# Patient Record
Sex: Male | Born: 1985
Health system: Southern US, Community
[De-identification: ages and names within clinical notes are randomized; demographics above are authoritative.]

## PROBLEM LIST (undated history)

## (undated) DIAGNOSIS — J45909 Unspecified asthma, uncomplicated: Secondary | ICD-10-CM

## (undated) HISTORY — DX: Unspecified asthma, uncomplicated: J45.909

---

## 2013-09-03 ENCOUNTER — Telehealth (INDEPENDENT_AMBULATORY_CARE_PROVIDER_SITE_OTHER): Payer: Self-pay

## 2013-09-03 ENCOUNTER — Ambulatory Visit (INDEPENDENT_AMBULATORY_CARE_PROVIDER_SITE_OTHER): Payer: BC Managed Care – PPO | Admitting: General Surgery

## 2013-09-03 ENCOUNTER — Encounter (INDEPENDENT_AMBULATORY_CARE_PROVIDER_SITE_OTHER): Payer: Self-pay | Admitting: General Surgery

## 2013-09-03 ENCOUNTER — Ambulatory Visit
Admission: RE | Admit: 2013-09-03 | Discharge: 2013-09-03 | Disposition: A | Discharge status: Home / Self Care | Payer: BC Managed Care – PPO | Source: Ambulatory Visit | Attending: General Surgery | Admitting: General Surgery

## 2013-09-03 VITALS — BP 118/70 | Resp 18 | Ht 71.0 in | Wt 201.0 lb

## 2013-09-03 DIAGNOSIS — R1032 Left lower quadrant pain: Secondary | ICD-10-CM

## 2013-09-03 DIAGNOSIS — M549 Dorsalgia, unspecified: Secondary | ICD-10-CM

## 2013-09-03 NOTE — Progress Notes (Signed)
Subjective:     Patient ID: Randy Harmon, male   DOB: 09/05/1985, 28 y.o.   MRN: [id number]  HPI The patient is a 28 year old male who is being seen today secondary to a chief complaint of left groin/abdominal pain. 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 He states it back in December he knows while lifting heavy item furniture some stinging sensation as well as a pop and a burning sensation to his abdominal wall and left inguinal area. The patient states that since that time he's had conservative measures to include no heavy lifting and ice compresses. He states that since that time the pain has not gotten any better. He notes that his pain at times is while lying down. He states that he has more pain in the day as opposed to beginning the day. He has noticed no bulges. She does feel that a majority of his pain is in his left abdominal/inguinal area and radiates down into his testicle.    Review of Systems  Constitutional: Negative.   HENT: Negative.   Eyes: Negative.   Respiratory: Negative.   Cardiovascular: Negative.   Gastrointestinal: Positive for abdominal pain.  Endocrine: Negative.   Neurological: Negative.        Objective:   Physical Exam  Constitutional: He is oriented to person, place, and time. He appears well-developed and well-nourished.  HENT:  Head: Normocephalic and atraumatic.  Eyes: Conjunctivae and EOM are normal. Pupils are equal, round, and reactive to light.  Neck: Normal range of motion. Neck supple.  Cardiovascular: Normal rate, regular rhythm and normal heart sounds.   Pulmonary/Chest: Effort normal and breath sounds normal.  Abdominal: Soft. Bowel sounds are normal. He exhibits no distension and no mass. There is no tenderness. There is no rebound and no guarding. Hernia confirmed negative in the ventral area, confirmed negative in the right inguinal area and confirmed negative in the left inguinal area.   No overt hernia felt, however there is some weakness of the left inguinal abdominal wall.  Musculoskeletal: Normal range of motion.  Neurological: He is alert and oriented to person, place, and time.  Skin: Skin is warm and dry.       Assessment:     28 year old male with a possible left inguinal hernia     Plan:     1. At this time is difficult to see whether or not the patient does have an inguinal hernia. Will proceed with CT scan of the abdomen and pelvis to evaluate for possible local/abdominal wall as well as a left inguinal hernia. This could potentially be related to his spine and this can be evaluated I think as well as CT scan. 2. Once his results of didn't seem will call the patient and discussed the next step. This would entail either surgery or referral to orthopedic Physician. I have discussed this plan with him and he agrees.

## 2013-09-03 NOTE — Telephone Encounter (Signed)
Called pt to notify him that Dr Derrell Lollingamirez reviewed the CT scan from today and it does not show any evidence of a hernia or reasons for the abdominal pain. I advised pt that there is nothing surgical to be done at this time for his issues per Dr Derrell Lollingamirez. I did offer to refer the pt to see a orthopedic for poss. Herniated disc to see if this was causing any of the issues or the pt could go back to doctor that referred him to us today. The pt would like a referral to see orthopedic. I will place the order in epic and have the referral coordinator give the pt a call back with the appt info. The pt understands.

## 2013-09-03 NOTE — Addendum Note (Signed)
Addended by: Ethlyn GallerySPILLERS, Bryceson Grape on: 09/03/2013 09:43 AM   Modules accepted: Orders

## 2013-09-04 ENCOUNTER — Telehealth (INDEPENDENT_AMBULATORY_CARE_PROVIDER_SITE_OTHER): Payer: Self-pay | Admitting: *Deleted

## 2013-09-04 NOTE — Telephone Encounter (Signed)
Spoke to pt and made him aware of his appt at [facility] 3.24.15 arrival time 9:30 w/ appt time 9:45.  Pt understands and is in agreeance

## 2017-01-23 ENCOUNTER — Encounter (INDEPENDENT_AMBULATORY_CARE_PROVIDER_SITE_OTHER): Payer: Self-pay | Admitting: Orthopaedic Surgery

## 2017-01-23 ENCOUNTER — Ambulatory Visit (INDEPENDENT_AMBULATORY_CARE_PROVIDER_SITE_OTHER): Payer: BLUE CROSS/BLUE SHIELD | Admitting: Orthopaedic Surgery

## 2017-01-23 ENCOUNTER — Ambulatory Visit (INDEPENDENT_AMBULATORY_CARE_PROVIDER_SITE_OTHER): Payer: BLUE CROSS/BLUE SHIELD

## 2017-01-23 DIAGNOSIS — M5441 Lumbago with sciatica, right side: Secondary | ICD-10-CM | POA: Diagnosis not present

## 2017-01-23 DIAGNOSIS — M545 Low back pain, unspecified: Secondary | ICD-10-CM

## 2017-01-23 DIAGNOSIS — M5416 Radiculopathy, lumbar region: Secondary | ICD-10-CM | POA: Diagnosis not present

## 2017-01-23 NOTE — Progress Notes (Signed)
Office Visit Note   Patient: Randy Harmon           Date of Birth: August 25, 1985           MRN: [id number] Visit Date: 01/23/2017              Requested by: No referring provider defined for this encounter. PCP: Verlon AuBoyd, Tammy Lamonica, MD   Assessment & Plan: Visit Diagnoses:  1. Lumbar pain   2. Radiculopathy, lumbar region     Plan: With patient's ongoing chronic symptoms and failed conservative treatment with activity modification, steroid taper, muscle relaxer, pain medication, over-the-counter Tylenol/ibuprofen, home excise program recommend getting lumbar spine MRI to rule out HNP/stenosis and we'll compare this to previous study done 2015. He'll follow-up after completion to discuss results and further treatment options. His primary care physician has given him a note for work with restrictions and it is okay for him to continue that per her recommendation.  Follow-Up Instructions: Return in about 3 weeks (around 02/13/2017) for Review lumbar spine MRI with Dr. Ophelia CharterYates.   Orders:  Orders Placed This Encounter  Procedures  . XR Lumbar Spine 2-3 Views  . MR Lumbar Spine w/o contrast   No orders of the defined types were placed in this encounter.     Procedures: No procedures performed   Clinical Data: No additional findings.   Subjective: No chief complaint on file.   HPI Patient comes in today with complaints of chronic low back pain and left lower extremity pain, numbness/tingling, weakness. Seen by Dr.nitka in our office for lumbar spine issues 2015. Was conservatively managed with physical therapy, he had a prednisone taper, and also had lumbar ESI with Dr. Alvester MorinNewton. Has not been back to our office. Has continued to have chronic symptoms but has been trying to manage this with home excised program that was given by the therapist. He is also taking over-the-counter Tylenol and ibuprofen. Has been to an urgent care last month and was prescribed prednisone taper 2 weeks.  He was having some neck issues at that time in the medication help that problem but not his lumbar. Primary care physician also prescribed tramadol and baclofen.  MRI lumbar spine 09/17/2013 report read grade 1 retrolisthesis of L4 on L5. Small Schmorl's nodes are noted at L3-4. L3-4 disc bulge eccentric to the left results in mild left and minimal right neural foraminal stenosis. There is mild left lateral recess narrowing. Transitional lumbosacral anatomy with sacralization of L5. Not having any symptoms in the right lower extremity. Low back and left leg aggravated with all activity. No complaints of bowel or bladder continence.    Review of Systems No current cardiac pulmonary GI GU issues  Objective: Vital Signs: There were no vitals taken for this visit.  Physical Exam  Constitutional: He is oriented to person, place, and time. He appears well-developed and well-nourished. No distress.  HENT:  Head: Normocephalic and atraumatic.  Eyes: Pupils are equal, round, and reactive to light. EOM are normal.  Neck: Normal range of motion.  No brachial plexus or trapezius tenderness.  Pulmonary/Chest: No respiratory distress.  Musculoskeletal:  Gait is normal. Can heel and toe gait but does complain of some left buttock pain with doing this. Pain with lumbar extension. Pain with lumbar flexion with hands to mid thigh. Has left lumbar paraspinal tenderness. Positive left sciatic notch and this. Negative logroll. Discomfort with left straight leg raise. Negative on the right side. Trace anterior tib and gastroc weakness. Bilateral  calves are nontender. Bilateral upper extremities are strong. Left shoulder good range of motion. Mildly positive impingement test. Negative drop arm. Good cuff strength.  Neurological: He is alert and oriented to person, place, and time.  Skin: Skin is warm and dry.  Psychiatric: He has a normal mood and affect.    Ortho Exam  Specialty Comments:  No specialty  comments available.  Imaging: Xr Lumbar Spine 2-3 Views  Result Date: 01/23/2017 X-rays lumbar spine that show mild degenerative disc disease. No acute finding. Transitional lumbosacral anatomy.    PMFS History: There are no active problems to display for this patient.  Past Medical History:  Diagnosis Date  . Asthma     No family history on file.  No past surgical history on file. Social History   Occupational History  . Not on file.   Social History Main Topics  . Smoking status: Never Smoker  . Smokeless tobacco: Not on file  . Alcohol use No  . Drug use: No  . Sexual activity: Not on file

## 2017-01-29 ENCOUNTER — Ambulatory Visit (INDEPENDENT_AMBULATORY_CARE_PROVIDER_SITE_OTHER): Payer: Self-pay | Admitting: Orthopaedic Surgery

## 2017-02-02 ENCOUNTER — Ambulatory Visit
Admission: RE | Admit: 2017-02-02 | Discharge: 2017-02-02 | Disposition: A | Discharge status: Home / Self Care | Payer: BLUE CROSS/BLUE SHIELD | Source: Ambulatory Visit | Attending: Surgery | Admitting: Surgery

## 2017-02-02 DIAGNOSIS — M5416 Radiculopathy, lumbar region: Secondary | ICD-10-CM

## 2017-02-13 ENCOUNTER — Encounter (INDEPENDENT_AMBULATORY_CARE_PROVIDER_SITE_OTHER): Payer: Self-pay | Admitting: Orthopaedic Surgery

## 2017-02-13 ENCOUNTER — Ambulatory Visit (INDEPENDENT_AMBULATORY_CARE_PROVIDER_SITE_OTHER): Payer: BLUE CROSS/BLUE SHIELD | Admitting: Orthopaedic Surgery

## 2017-02-13 VITALS — BP 146/99 | HR 74 | Ht 71.0 in | Wt 210.0 lb

## 2017-02-13 DIAGNOSIS — M5126 Other intervertebral disc displacement, lumbar region: Secondary | ICD-10-CM

## 2017-02-13 NOTE — Progress Notes (Signed)
Office Visit Note   Patient: Randy Harmon           Date of Birth: 28-Oct-1985           MRN: [id number] Visit Date: 02/13/2017              Requested by: Verlon Au, MD [address] [place], [place] [address] PCP: Verlon Au, MD   Assessment & Plan: Visit Diagnoses:  1. Protrusion of lumbar intervertebral disc        Broad-based L4-5 protrusion with tiny central component.  Plan: Patient has absent nerve root tension signs. No isolated motor weakness. 2015 after a week or 2 of pain he did notice relief after epidural and he can call the like to repeat an epidural injection for the L4-5 disc protrusion. We reviewed the MRI scan that he had as well as one in 2015 and discussed in detail disc degeneration which runs in his family. He is active and has some modifications at work where he can change sitting and standing positions. Prescription was given for these to be continued. 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 At present he does not have indications for surgical intervention. He can call if he like to proceed with an epidural. We discussed workout activities with weight lifting modifications to avoid activities are likely to aggravate his disc problem. He understands he may get some improvement with time. I plan to recheck him in 3 months.  Follow-Up Instructions: Return in about 3 months (around 05/16/2017).   Orders:  No orders of the defined types were placed in this encounter.  No orders of the defined types were placed in this encounter.     Procedures: No procedures performed   Clinical Data: No additional findings.   Subjective: Chief Complaint  Patient presents with  . Lower Back - Pain, Follow-up    HPI 31 year old male is seen with ongoing problems with back pain he states his pain is 8 out of 10 in his lower back radiates in his left leg down to his foot. He states he had an episode with leg gave way on him. He likes to workout  regularly. He states his back aches all the time he has some numbness and tingling at times mostly in the left leg. He finished a prednisone pack but did not seem to get a lot of relief. He says symptoms since 2015. Currently is using some tramadol and hydrocodone. He takes meloxicam which seems to help some. His father has some back problems and a mother who had the and injury were all working also has back problems. Denies fever chills no gallbladder symptoms.  Review of Systems  Constitutional: Negative for chills and diaphoresis.  HENT: Negative for ear discharge, ear pain and nosebleeds.   Eyes: Negative for discharge and visual disturbance.  Respiratory: Negative for cough, choking and shortness of breath.   Cardiovascular: Negative for chest pain and palpitations.  Gastrointestinal: Negative for abdominal distention and abdominal pain.  Endocrine: Negative for cold intolerance and heat intolerance.  Genitourinary: Negative for flank pain and hematuria.  Musculoskeletal: Positive for back pain.  Skin: Negative for rash and wound.  Neurological: Negative for seizures and speech difficulty.  Hematological: Negative for adenopathy. Does not bruise/bleed easily.  Psychiatric/Behavioral: Negative for agitation and suicidal ideas.     Objective: Vital Signs: BP (!) 146/99   Pulse 74   Ht 5\' 11"  (1.803 m)   Wt 210 lb (95.3 kg)  BMI 29.29 kg/m   Physical Exam  Constitutional: He is oriented to person, place, and time. He appears well-developed and well-nourished.  HENT:  Head: Normocephalic and atraumatic.  Eyes: Pupils are equal, round, and reactive to light. EOM are normal.  Neck: No tracheal deviation present. No thyromegaly present.  Cardiovascular: Normal rate.   Pulmonary/Chest: Effort normal. He has no wheezes.  Abdominal: Soft. Bowel sounds are normal.  Musculoskeletal:  Patient gets from sitting standing on off the exam table easily. He can heel and toe walk. No isolated  motor weakness anterior tib EHL is perineal skin gastrocsoleus. Quads hamstrings are strong. He has mild sciatic notch tenderness. No rash over exposed skin no pain with hip range of motion is reach full extension. No synovitis of the wrist or fingers.  Neurological: He is alert and oriented to person, place, and time.  Skin: Skin is warm and dry. Capillary refill takes less than 2 seconds.  Psychiatric: He has a normal mood and affect. His behavior is normal. Judgment and thought content normal.    Ortho Exam  Specialty Comments:  No specialty comments available.  Imaging: Study Result   CLINICAL DATA:  Low back pain, left leg pain and weakness  EXAM: MRI LUMBAR SPINE WITHOUT CONTRAST  TECHNIQUE: Multiplanar, multisequence MR imaging of the lumbar spine was performed. No intravenous contrast was administered.  COMPARISON:  None.  FINDINGS: Segmentation: Transitional anatomy with sacral is a shin of the L5 vertebral body.  Alignment:  Physiologic.  Vertebrae:  No fracture, evidence of discitis, or bone lesion.  Conus medullaris: Extends to the T12 level and appears normal.  Paraspinal and other soft tissues: No paraspinal abnormality.  Disc levels:  Disc spaces: Disc heights are maintained.  T12-L1: No significant disc bulge. No evidence of neural foraminal stenosis. No central canal stenosis.  L1-L2: No significant disc bulge. No evidence of neural foraminal stenosis. No central canal stenosis.  L2-L3: Minimal broad-based disc bulge. No evidence of neural foraminal stenosis. No central canal stenosis.  L3-L4: Broad-based disc bulge. No evidence of neural foraminal stenosis. No central canal stenosis.  L4-L5: Broad-based disc bulge with a tiny central disc protrusion. No evidence of neural foraminal stenosis. No central canal stenosis.  L5-S1: No significant disc bulge. No evidence of neural foraminal stenosis. No central canal  stenosis.  IMPRESSION: 1. Mild broad-based disc bulges at L2-3 and L3-4. 2. At L4-5 there is a broad-based disc bulge with a tiny central disc protrusion. No foraminal or central canal stenosis.   Electronically Signed   By: Elige KoHetal  Patel   On: 02/02/2017 09:04       PMFS History: There are no active problems to display for this patient.  Past Medical History:  Diagnosis Date  . Asthma     No family history on file.  No past surgical history on file. Social History   Occupational History  . Not on file.   Social History Main Topics  . Smoking status: Never Smoker  . Smokeless tobacco: Never Used  . Alcohol use No  . Drug use: No  . Sexual activity: Not on file

## 2017-05-15 ENCOUNTER — Encounter (INDEPENDENT_AMBULATORY_CARE_PROVIDER_SITE_OTHER): Payer: Self-pay | Admitting: Orthopaedic Surgery

## 2017-05-15 ENCOUNTER — Ambulatory Visit (INDEPENDENT_AMBULATORY_CARE_PROVIDER_SITE_OTHER): Payer: BLUE CROSS/BLUE SHIELD | Admitting: Orthopaedic Surgery

## 2017-05-15 VITALS — BP 141/90 | HR 57

## 2017-05-15 DIAGNOSIS — M5126 Other intervertebral disc displacement, lumbar region: Secondary | ICD-10-CM

## 2017-05-15 DIAGNOSIS — M5136 Other intervertebral disc degeneration, lumbar region: Secondary | ICD-10-CM

## 2017-05-15 DIAGNOSIS — M51369 Other intervertebral disc degeneration, lumbar region without mention of lumbar back pain or lower extremity pain: Secondary | ICD-10-CM

## 2017-05-22 ENCOUNTER — Encounter (INDEPENDENT_AMBULATORY_CARE_PROVIDER_SITE_OTHER): Payer: Self-pay | Admitting: Orthopaedic Surgery

## 2017-05-22 NOTE — Progress Notes (Signed)
Office Visit Note   Patient: Randy Harmon           Date of Birth: Sep 28, 1985           MRN: [id number] Visit Date: 05/15/2017              Requested by: Verlon AuBoyd, Tammy Lamonica, MD [address] [place], [place] [id number] PCP: Verlon AuBoyd, Tammy Lamonica, MD   Assessment & Plan: Visit Diagnoses:  1. Bulge of lumbar disc without myelopathy     Plan: Patient has 3 young children.  We discussed the MRI findings with slight bulge levels in the lumbar spine without compression.  Patient had a normal CT abdomen and pelvis in 2015.  Walking program for strengthening exercises.  He does not have any areas that require surgical at this point spine wellness program for the symptoms that he is having.  12 months return visit for recheck.  Follow-Up Instructions: Return in about 12 months (around 05/15/2018).   Orders:  No orders of the defined types were placed in this encounter.  No orders of the defined types were placed in this encounter.     Procedures: No procedures performed   Clinical Data: No additional findings.   Subjective: Chief Complaint  Patient presents with  . Lower Back - Pain, Follow-up    HPI 31 year old male returns persistent problems with back pain with intermittent flareup pain that radiates mostly down the left side.  He has difficulty sitting or standing for long period of time problems sleeping on the left side sometimes he has some pins and needles in his left leg.  Tylenol and home exercise program.  The graft 2 months ago and had some increased pain.  Problems with that activity.  MRI scan has been obtained findings.  Ultracet also Mobic short-term treatment with hydrocodone.  2015 he went through physical therapy as well as prednisone taper and had one epidural injection with Dr. Alvester MorinNewton.  He is also been on baclofen.  No symptoms on his right leg.  No bowel or bladder symptoms.  Review of Systems 14 point review of systems updated unchanged from  01/2017 office visit.   Objective: Vital Signs: BP (!) 141/90   Pulse (!) 57   Physical Exam  Constitutional: He is oriented to person, place, and time. He appears well-developed and well-nourished.  HENT:  Head: Normocephalic and atraumatic.  Eyes: EOM are normal. Pupils are equal, round, and reactive to light.  Neck: No tracheal deviation present. No thyromegaly present.  Cardiovascular: Normal rate.  Pulmonary/Chest: Effort normal. He has no wheezes.  Abdominal: Soft. Bowel sounds are normal.  Neurological: He is alert and oriented to person, place, and time.  Skin: Skin is warm and dry. Capillary refill takes less than 2 seconds.  Psychiatric: He has a normal mood and affect. His behavior is normal. Judgment and thought content normal.    Ortho Exam patient can heel and toe walk.  He has mild left sciatic notch tenderness.  Negative logroll to the hip.  Popliteal compression test.  Anterior tib EHL is intact.  No calf tenderness.  Distal pulses are normal.  Normal upper extremity reflexes.  Back is normal.  Specialty Comments:  No specialty comments available.  Imaging: CLINICAL DATA:  Low back pain, left leg pain and weakness  EXAM: MRI LUMBAR SPINE WITHOUT CONTRAST  TECHNIQUE: Multiplanar, multisequence MR imaging of the lumbar spine was performed. No intravenous contrast was administered.  COMPARISON:  None.  FINDINGS: Segmentation: Transitional anatomy with sacral is a shin of the L5 vertebral body.  Alignment:  Physiologic.  Vertebrae:  No fracture, evidence of discitis, or bone lesion.  Conus medullaris: Extends to the T12 level and appears normal.  Paraspinal and other soft tissues: No paraspinal abnormality.  Disc levels:  Disc spaces: Disc heights are maintained.  T12-L1: No significant disc bulge. No evidence of neural foraminal stenosis. No central canal stenosis.  L1-L2: No significant disc bulge. No evidence of neural  foraminal stenosis. No central canal stenosis.  L2-L3: Minimal broad-based disc bulge. No evidence of neural foraminal stenosis. No central canal stenosis.  L3-L4: Broad-based disc bulge. No evidence of neural foraminal stenosis. No central canal stenosis.  L4-L5: Broad-based disc bulge with a tiny central disc protrusion. No evidence of neural foraminal stenosis. No central canal stenosis.  L5-S1: No significant disc bulge. No evidence of neural foraminal stenosis. No central canal stenosis.  IMPRESSION: 1. Mild broad-based disc bulges at L2-3 and L3-4. 2. At L4-5 there is a broad-based disc bulge with a tiny central disc protrusion. No foraminal or central canal stenosis.   Electronically Signed   By: Elige KoHetal  Patel   On: 02/02/2017 09:04   PMFS History: There are no active problems to display for this patient.  Past Medical History:  Diagnosis Date  . Asthma     History reviewed. No pertinent family history.  History reviewed. No pertinent surgical history. Social History   Occupational History  . Not on file  Tobacco Use  . Smoking status: Never Smoker  . Smokeless tobacco: Never Used  Substance and Sexual Activity  . Alcohol use: No  . Drug use: No  . Sexual activity: Not on file

## 2017-10-21 DIAGNOSIS — Z0289 Encounter for other administrative examinations: Secondary | ICD-10-CM | POA: Diagnosis not present

## 2017-10-21 DIAGNOSIS — M5126 Other intervertebral disc displacement, lumbar region: Secondary | ICD-10-CM | POA: Diagnosis not present

## 2017-10-25 ENCOUNTER — Ambulatory Visit (INDEPENDENT_AMBULATORY_CARE_PROVIDER_SITE_OTHER): Payer: BLUE CROSS/BLUE SHIELD | Admitting: Orthopaedic Surgery

## 2018-01-20 DIAGNOSIS — R7301 Impaired fasting glucose: Secondary | ICD-10-CM | POA: Diagnosis not present

## 2018-01-20 DIAGNOSIS — J4520 Mild intermittent asthma, uncomplicated: Secondary | ICD-10-CM | POA: Diagnosis not present

## 2018-01-20 DIAGNOSIS — Z0000 Encounter for general adult medical examination without abnormal findings: Secondary | ICD-10-CM | POA: Diagnosis not present

## 2018-05-14 ENCOUNTER — Ambulatory Visit (INDEPENDENT_AMBULATORY_CARE_PROVIDER_SITE_OTHER): Payer: BLUE CROSS/BLUE SHIELD | Admitting: Orthopaedic Surgery

## 2018-06-06 ENCOUNTER — Ambulatory Visit (INDEPENDENT_AMBULATORY_CARE_PROVIDER_SITE_OTHER): Payer: 59 | Admitting: Orthopaedic Surgery

## 2018-06-06 ENCOUNTER — Encounter (INDEPENDENT_AMBULATORY_CARE_PROVIDER_SITE_OTHER): Payer: Self-pay | Admitting: Orthopaedic Surgery

## 2018-06-06 VITALS — BP 146/93 | HR 58 | Ht 71.0 in | Wt 220.0 lb

## 2018-06-06 DIAGNOSIS — M5416 Radiculopathy, lumbar region: Secondary | ICD-10-CM

## 2018-06-06 DIAGNOSIS — M2550 Pain in unspecified joint: Secondary | ICD-10-CM | POA: Diagnosis not present

## 2018-06-06 DIAGNOSIS — M7542 Impingement syndrome of left shoulder: Secondary | ICD-10-CM

## 2018-06-06 DIAGNOSIS — G8929 Other chronic pain: Secondary | ICD-10-CM | POA: Diagnosis not present

## 2018-06-06 DIAGNOSIS — M533 Sacrococcygeal disorders, not elsewhere classified: Secondary | ICD-10-CM

## 2018-06-06 LAB — CBC
HCT: 47.1 % (ref 38.5–50.0)
Hemoglobin: 16.1 g/dL (ref 13.2–17.1)
MCH: 29.1 pg (ref 27.0–33.0)
MCHC: 34.2 g/dL (ref 32.0–36.0)
MCV: 85 fL (ref 80.0–100.0)
MPV: 10 fL (ref 7.5–12.5)
Platelets: 379 10*3/uL (ref 140–400)
RBC: 5.54 10*6/uL (ref 4.20–5.80)
RDW: 12.4 % (ref 11.0–15.0)
WBC: 4.9 10*3/uL (ref 3.8–10.8)

## 2018-06-06 LAB — SEDIMENTATION RATE: SED RATE: 2 mm/h (ref 0–15)

## 2018-06-06 NOTE — Progress Notes (Signed)
Office Visit Note   Patient: Randy Harmon           Date of Birth: Jan 16, 1986           MRN: [id number] Visit Date: 06/06/2018              Requested by: Bartholome Bill, [address] [place], [place] [id number] PCP: Bartholome Bill, MD   Assessment & Plan: Visit Diagnoses:  1. Radiculopathy, lumbar region   2. Chronic left SI joint pain   3. Impingement syndrome of left shoulder   4. Polyarthralgia     Plan: Since patient continues have ongoing symptoms with chronic low back pain and left lower extremity radicular symptoms that has failed multiple methods of conservative treatment including inversion table, TENS, narcotic medication, oral NSAIDs I will repeat lumbar MRI to compare to study that was done June 2018.  By exam he also has pain over the left SI joint and with his report of left hip joint pain I will also get blood work today to check a CBC and arthritis panel.  He is not sure if he has a family history of inflammatory arthropathy.  Follow Dr. Lorin Mercy in 3 weeks to discuss MRI and blood work.  Advised patient that depending upon what is found on the MRI that we may also consider scheduling him to have a diagnostic/therapeutic left SI joint injection.  At some point he also may need further work-up of his cervical spine and left shoulder for issues there that we briefly discussed today.  All questions answered.  Follow-Up Instructions: Return in about 3 weeks (around 06/27/2018) for For review of lumbar MRI and blood work with Dr. Lorin Mercy.   Orders:  Orders Placed This Encounter  Procedures  . MR Lumbar Spine w/o contrast  . CBC  . Antinuclear Antib (ANA)  . Rheumatoid Factor  . Uric acid  . Sed Rate (ESR)   No orders of the defined types were placed in this encounter.     Procedures: No procedures performed   Clinical Data: No additional findings.   Subjective: Chief Complaint  Patient presents with  . Lower Back - Follow-up     HPI 32 year old white male returns for his yearly follow-up for low back pain and left leg symptoms.  He states that he has good days and bad days.  States that he uses his TENS unit 2-3 times per day along with using an inversion table at least 3 times a day.  Intermittently takes Norco prescribed by his primary care physician.  Also uses ibuprofen.  All these methods of conservative treatment give temporary improvement.  He has 3 small children and admits that he is not able to do a lot of playing activity with them due to his pain.  States he and his wife are expecting another child soon.  Localizes pain more to the left low back.  Pain also over the left SI joint.  Continues to have numbness and tingling radiating down the left leg.  No symptoms on the right side.  States that he gets off-and-on episodes of left groin pain as well.  This is not relieved with stretching exercises.  He is not sure if he has a family history of inflammatory arthropathy.  During questioning he brings up some off-and-on issues with left-sided neck pain and left lower arm numbness and tingling.  Also pain left shoulder with overactivity and reaching on his back.  Reports a  left shoulder forceful abduction/external rotation injury while playing football in high school.  This required an emergency room visit that led to several weeks of formal PT.    Review of Systems No current cardiac pulmonary GI GU issues  Objective: Vital Signs: BP (!) 146/93   Pulse (!) 58   Ht _0  (1.803 m)   Wt 220 lb (99.8 kg)   BMI 30.68 kg/m   Physical Exam HENT:     Head: Normocephalic and atraumatic.  Neck:     Comments: Service my good range of motion.  He has moderate to marked left brachial plexus tenderness.  Negative on the right side. Pulmonary:     Effort: Pulmonary effort is normal.     Breath sounds: Normal breath sounds.  Musculoskeletal:     Comments: Left shoulder has good range of motion but with some  discomfort.  Positive impingement test.  Positive O'Brien test.  Right shoulder unremarkable.  Bilateral skin range of motion.  Negative Tinel's over the cubital tunnels.  Bilateral wrist negative Phalen's and Tinel's.  Positive left greater than right SI joint tenderness.  Markedly positive left FABER test.  Negative on the right side.  Neurovascularly intact.  No focal motor deficits.  Bilateral knee and ankle exam unremarkable.  Skin:    General: Skin is warm and dry.  Neurological:     Mental Status: He is alert.  Psychiatric:        Mood and Affect: Mood normal.     Ortho Exam  Specialty Comments:  No specialty comments available.  Imaging: No results found.   PMFS History: There are no active problems to display for this patient.  Past Medical History:  Diagnosis Date  . Asthma     History reviewed. No pertinent family history.  History reviewed. No pertinent surgical history. Social History   Occupational History  . Not on file  Tobacco Use  . Smoking status: Never Smoker  . Smokeless tobacco: Never Used  Substance and Sexual Activity  . Alcohol use: No  . Drug use: No  . Sexual activity: Not on file

## 2018-06-09 LAB — URIC ACID: Uric Acid, Serum: 6.1 mg/dL (ref 4.0–8.0)

## 2018-06-09 LAB — ANA: Anti Nuclear Antibody(ANA): NEGATIVE

## 2018-06-09 LAB — RHEUMATOID FACTOR: Rheumatoid fact SerPl-aCnc: <14 IU/mL (ref <14)

## 2018-06-21 ENCOUNTER — Other Ambulatory Visit: Payer: BLUE CROSS/BLUE SHIELD

## 2018-06-23 DIAGNOSIS — M5126 Other intervertebral disc displacement, lumbar region: Secondary | ICD-10-CM | POA: Diagnosis not present

## 2018-09-15 DIAGNOSIS — K529 Noninfective gastroenteritis and colitis, unspecified: Secondary | ICD-10-CM | POA: Diagnosis not present

## 2019-05-30 IMAGING — MR MR LUMBAR SPINE W/O CM
4 of 5 series · 17 of 48 positions shown · non-contrast
Comparison: None.

CLINICAL DATA: Low back pain, left leg pain and weakness

EXAM:
MRI LUMBAR SPINE WITHOUT CONTRAST
TECHNIQUE: Multiplanar, multisequence MR imaging of the lumbar spine was
performed. No intravenous contrast was administered.

[Series 6: T2 · sagittal · 4.0mm · 0.76mm/px · 4 of 15 slices shown (1 of 2)]
[im 1/15]
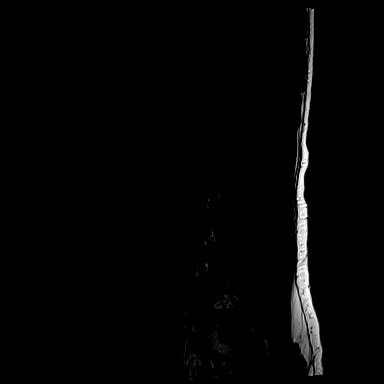
[im 5/15]
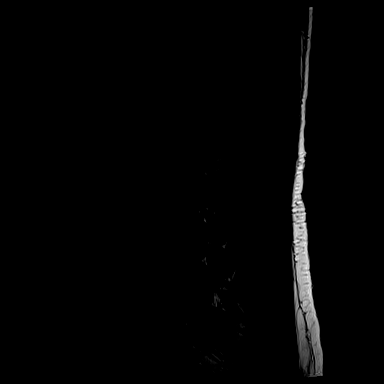
[im 10/15]
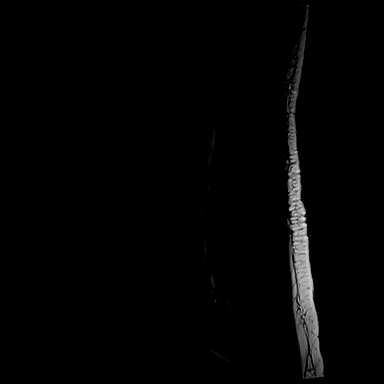
[im 15/15]
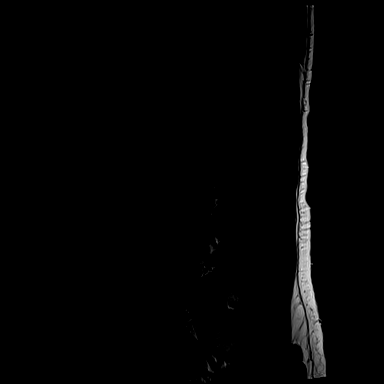

[Series 7: T1 · sagittal · 4.0mm · 0.76mm/px · 3 of 15 slices shown (1 of 2)]
[im 1/15]
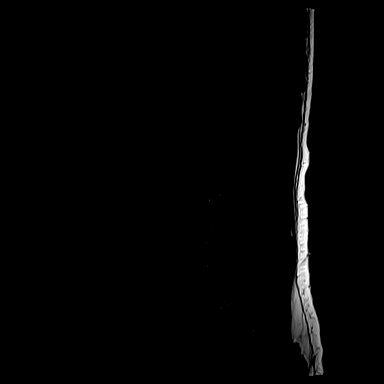
[im 8/15]
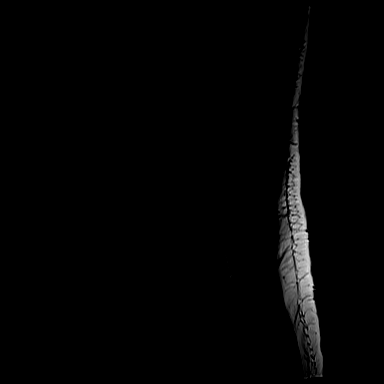
[im 15/15]
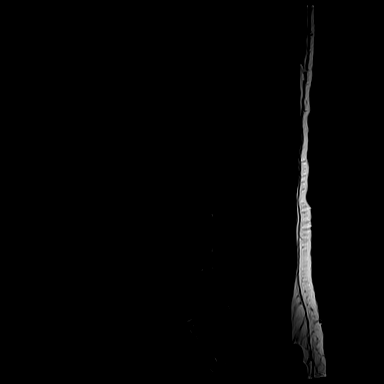

[Series 11: T1 · axial · 4.0mm · 0.28mm/px · z∈[-62,+144]mm · 3 of 54 slices shown (2 of 2)]
[im 7/54]
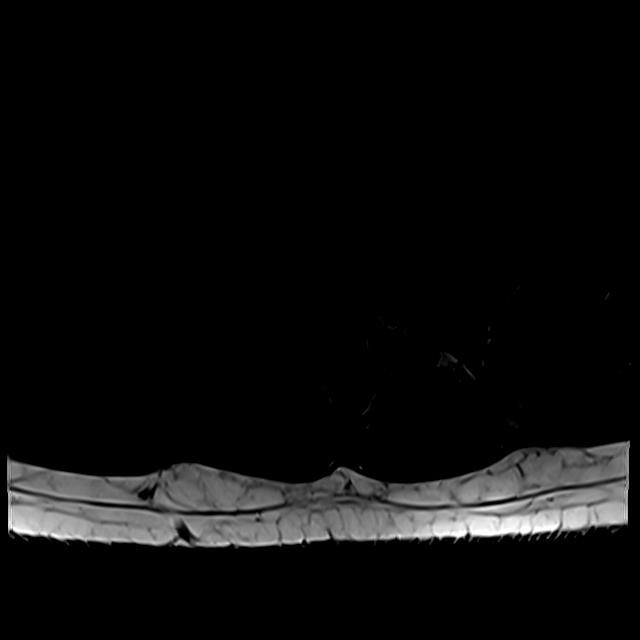
[im 27/54]
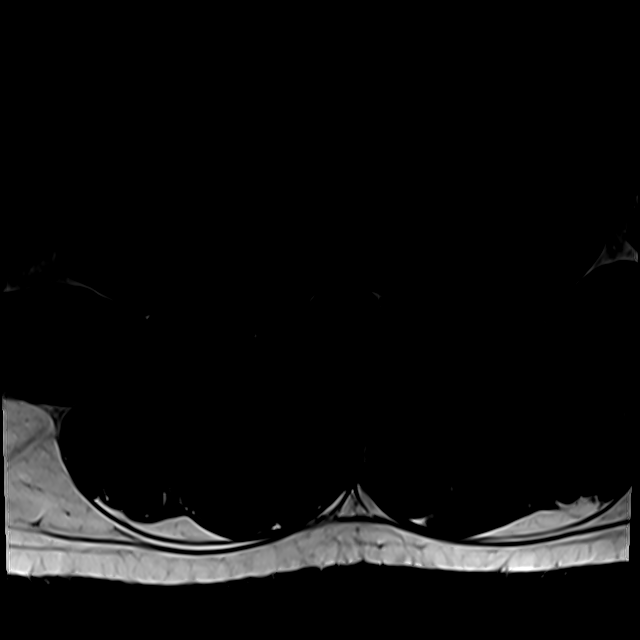
[im 47/54]
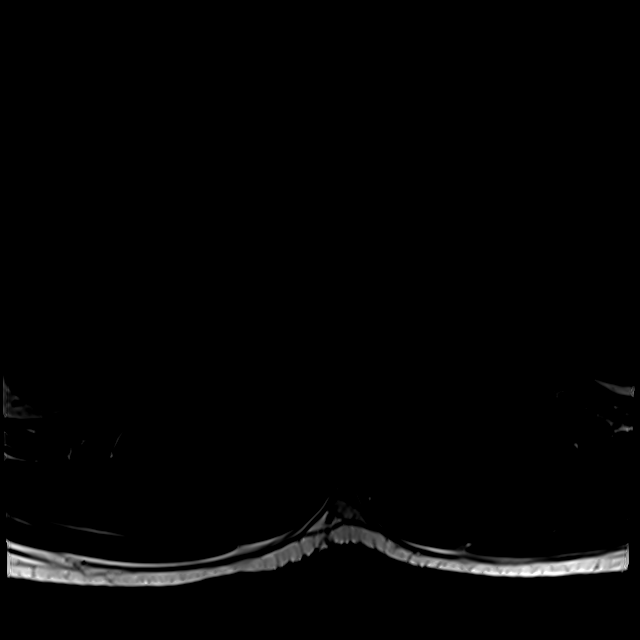

[Series 14: T2 · axial · 4.0mm · 0.28mm/px · z∈[-76,+144]mm · 7 of 54 slices shown (2 of 2)]
[im 4/54]
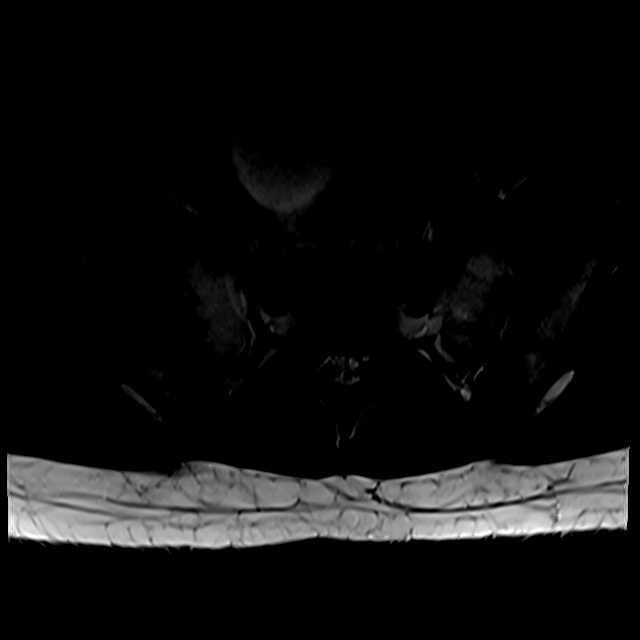
[im 7/54]
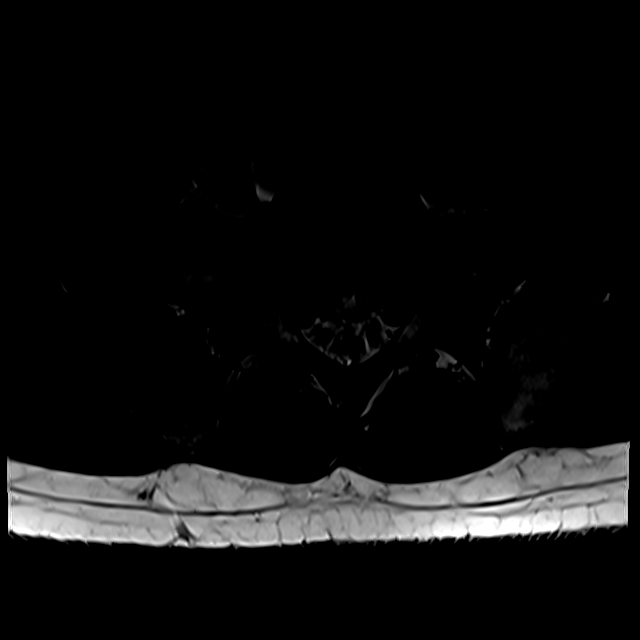
[im 10/54]
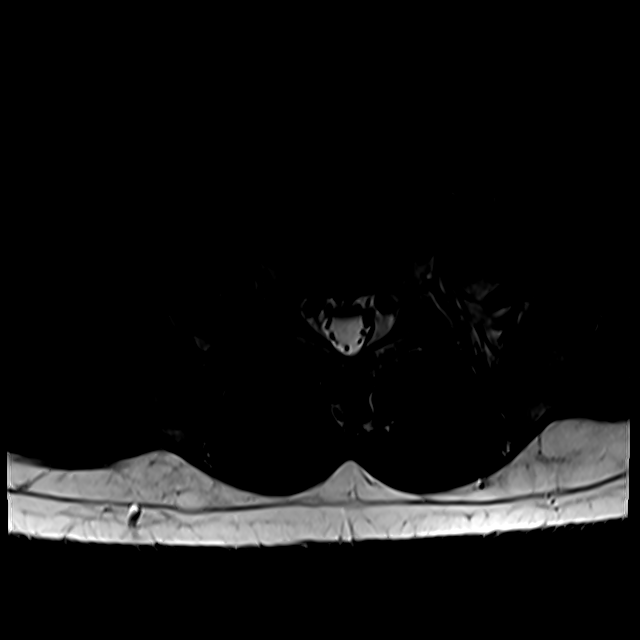
[im 17/54]
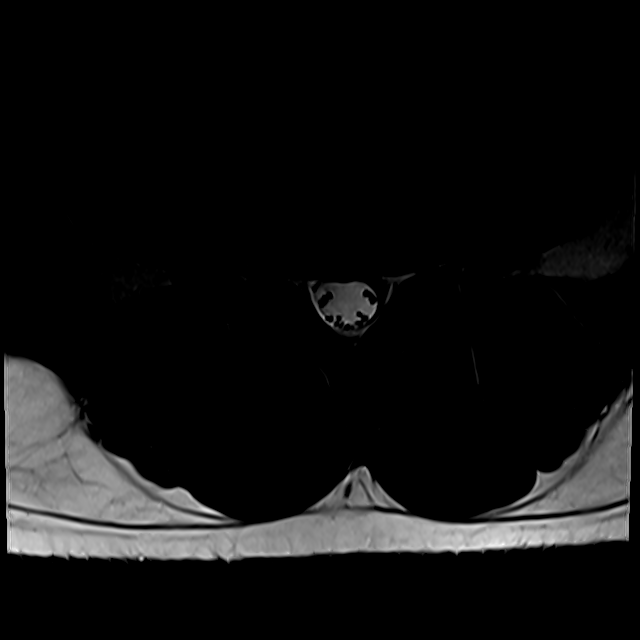
[im 24/54]
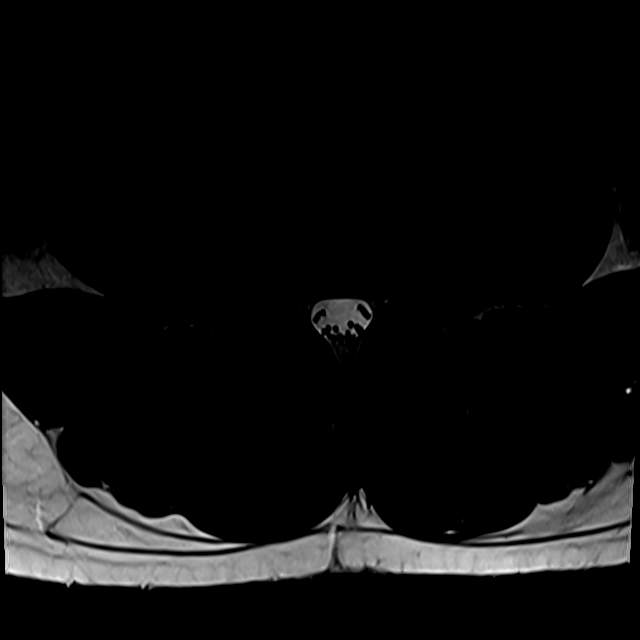
[im 27/54]
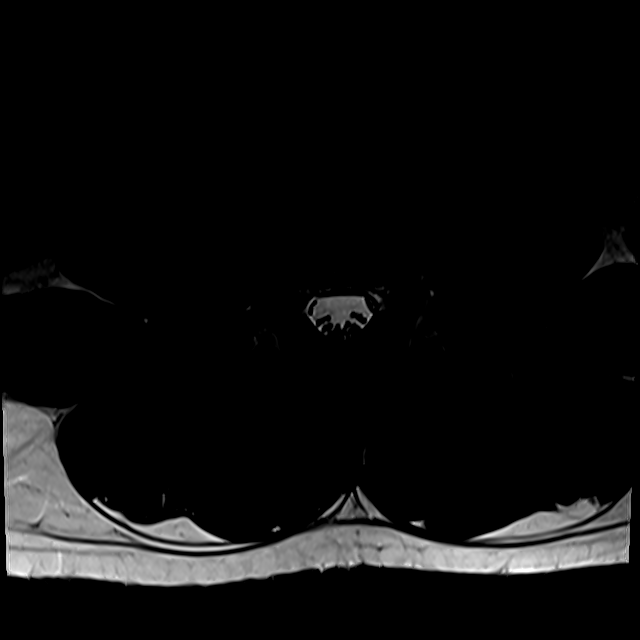
[im 47/54]
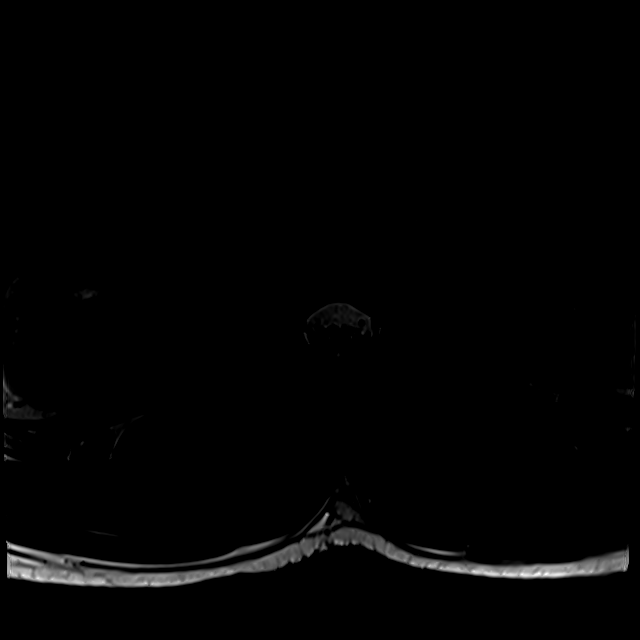

[17 of 48 positions shown; findings below may reference images not displayed]

FINDINGS: Segmentation: Transitional anatomy with sacral is a shin of the L5
vertebral body.

Alignment:  Physiologic.

Vertebrae:  No fracture, evidence of discitis, or bone lesion.

Conus medullaris: Extends to the T12 level and appears normal.

Paraspinal and other soft tissues: No paraspinal abnormality.

Disc levels:

Disc spaces: Disc heights are maintained.

T12-L1: No significant disc bulge. No evidence of neural foraminal
stenosis. No central canal stenosis.

L1-L2: No significant disc bulge. No evidence of neural foraminal
stenosis. No central canal stenosis.

L2-L3: Minimal broad-based disc bulge. No evidence of neural
foraminal stenosis. No central canal stenosis.

L3-L4: Broad-based disc bulge. No evidence of neural foraminal
stenosis. No central canal stenosis.

L4-L5: Broad-based disc bulge with a tiny central disc protrusion.
No evidence of neural foraminal stenosis. No central canal stenosis.

L5-S1: No significant disc bulge. No evidence of neural foraminal
stenosis. No central canal stenosis.
IMPRESSION: 1. Mild broad-based disc bulges at L2-3 and L3-4.
2. At L4-5 there is a broad-based disc bulge with a tiny central
disc protrusion. No foraminal or central canal stenosis.
